# Patient Record
Sex: Female | Born: 1980 | Race: White | Hispanic: No | Marital: Single | Smoking: Current every day smoker
Health system: Southern US, Community
[De-identification: ages and names within clinical notes are randomized; demographics above are authoritative.]

---
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
## 2002-11-20 ENCOUNTER — Ambulatory Visit (HOSPITAL_COMMUNITY): Admission: RE | Admit: 2002-11-20 | Discharge: 2002-11-20 | Payer: Self-pay | Admitting: Obstetrics & Gynecology

## 2002-11-20 ENCOUNTER — Encounter: Payer: Self-pay | Admitting: Obstetrics & Gynecology

## 2003-04-13 ENCOUNTER — Inpatient Hospital Stay (HOSPITAL_COMMUNITY): Admission: AD | Admit: 2003-04-13 | Discharge: 2003-04-14 | Payer: Self-pay | Admitting: Obstetrics

## 2003-05-09 ENCOUNTER — Inpatient Hospital Stay (HOSPITAL_COMMUNITY): Admission: AD | Admit: 2003-05-09 | Discharge: 2003-05-09 | Payer: Self-pay | Admitting: Obstetrics & Gynecology

## 2003-05-17 ENCOUNTER — Inpatient Hospital Stay (HOSPITAL_COMMUNITY): Admission: AD | Admit: 2003-05-17 | Discharge: 2003-05-17 | Payer: Self-pay | Admitting: Obstetrics & Gynecology

## 2003-06-20 ENCOUNTER — Inpatient Hospital Stay (HOSPITAL_COMMUNITY): Admission: AD | Admit: 2003-06-20 | Discharge: 2003-06-23 | Payer: Self-pay | Admitting: Obstetrics

## 2003-06-21 ENCOUNTER — Encounter (INDEPENDENT_AMBULATORY_CARE_PROVIDER_SITE_OTHER): Payer: Self-pay | Admitting: Specialist

## 2004-01-16 ENCOUNTER — Emergency Department (HOSPITAL_COMMUNITY): Admission: EM | Admit: 2004-01-16 | Discharge: 2004-01-16 | Payer: Self-pay | Admitting: Emergency Medicine

## 2004-02-07 ENCOUNTER — Encounter
Admission: RE | Admit: 2004-02-07 | Discharge: 2004-04-01 | Payer: Self-pay | Admitting: Physical Medicine and Rehabilitation

## 2004-03-23 ENCOUNTER — Encounter
Admission: RE | Admit: 2004-03-23 | Discharge: 2004-03-23 | Payer: Self-pay | Admitting: Physical Medicine and Rehabilitation

## 2004-03-25 ENCOUNTER — Encounter
Admission: RE | Admit: 2004-03-25 | Discharge: 2004-03-25 | Payer: Self-pay | Admitting: Physical Medicine and Rehabilitation

## 2004-03-30 ENCOUNTER — Emergency Department (HOSPITAL_COMMUNITY): Admission: EM | Admit: 2004-03-30 | Discharge: 2004-03-30 | Payer: Self-pay | Admitting: Emergency Medicine

## 2005-07-19 ENCOUNTER — Emergency Department (HOSPITAL_COMMUNITY): Admission: EM | Admit: 2005-07-19 | Discharge: 2005-07-19 | Payer: Self-pay | Admitting: Emergency Medicine

## 2005-08-06 ENCOUNTER — Ambulatory Visit (HOSPITAL_COMMUNITY): Admission: RE | Admit: 2005-08-06 | Discharge: 2005-08-06 | Payer: Self-pay | Admitting: Obstetrics

## 2005-08-06 IMAGING — US US TRANSVAGINAL NON-OB
1 series · 13 of 25 positions shown · non-contrast
Comparison: none

CLINICAL DATA: Verify IUD placement.  LMP [DATE].
 TRANSABDOMINAL AND ENDOVAGINAL PELVIC ULTRASOUND:
TECHNIQUE: Both transabdominal and transvaginal ultrasound examinations of the pelvis were performed including evaluation of the uterus, ovaries, adnexal regions, and pelvic cul-de-sac.

[Series 1: us transvaginal non-ob · 0.27mm/px · 13 of 65 slices shown]
[im 1/65]
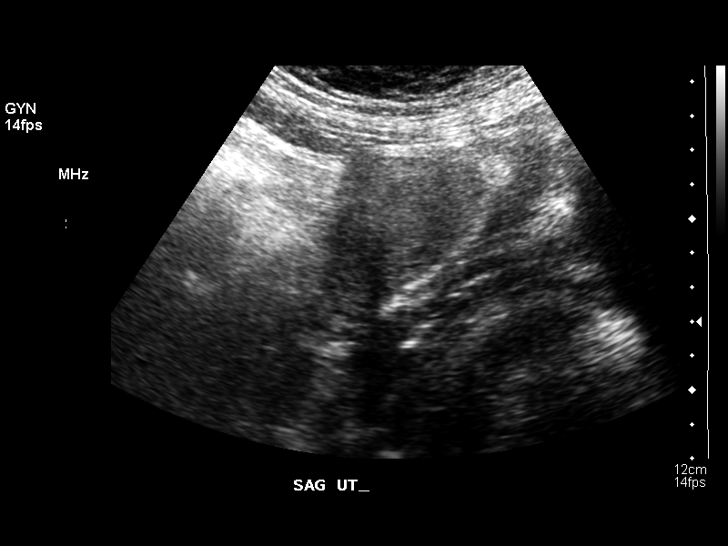
[im 6/65]
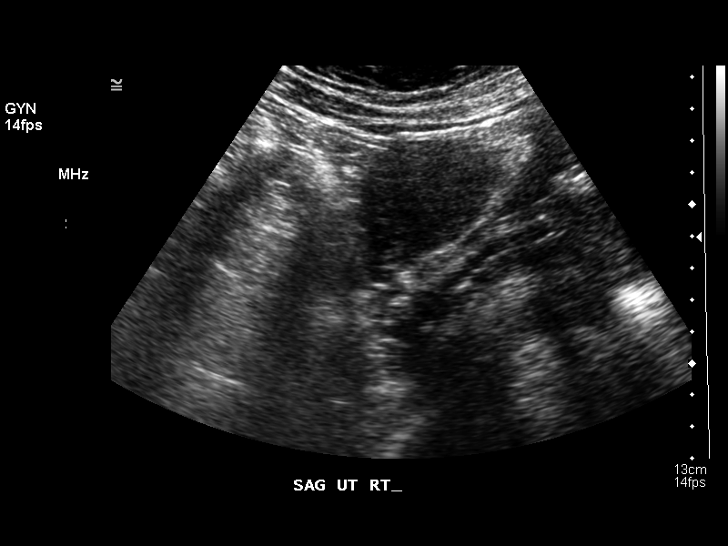
[im 11/65]
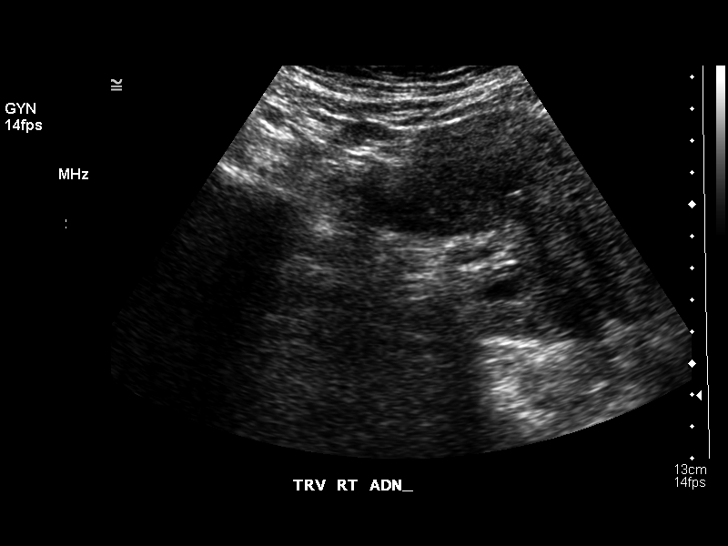
[im 17/65]
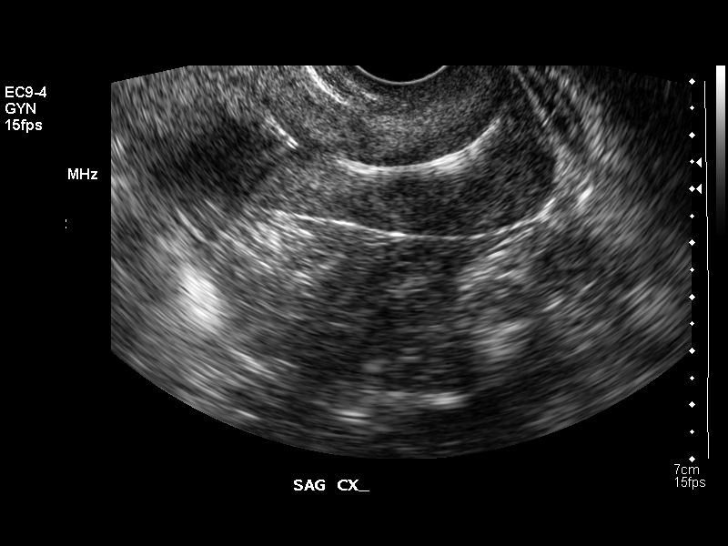
[im 22/65]
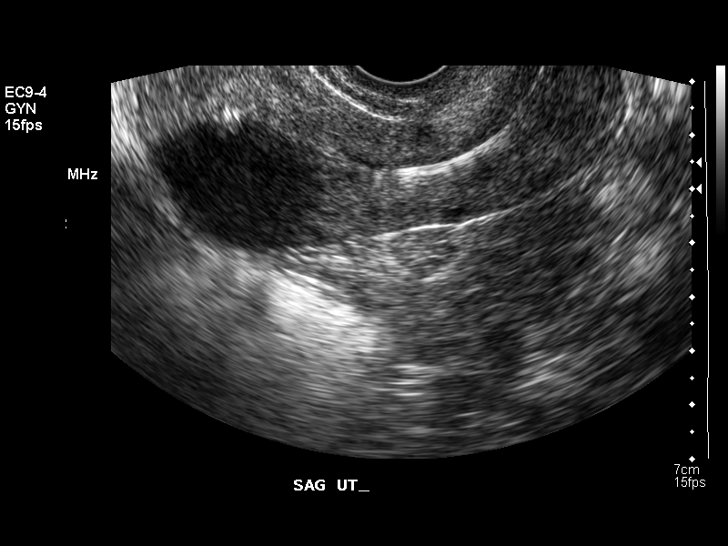
[im 27/65]
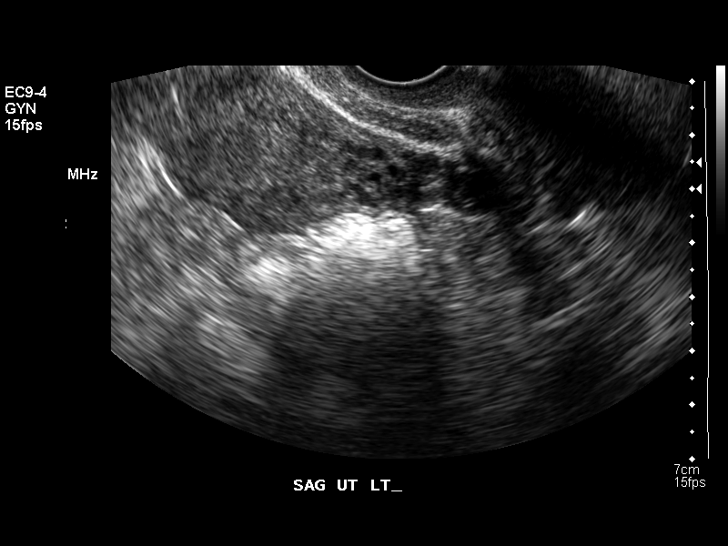
[im 33/65]
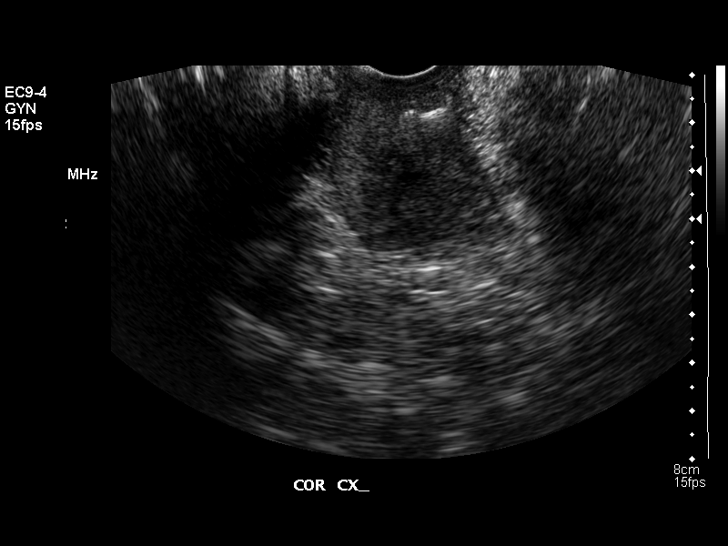
[im 38/65]
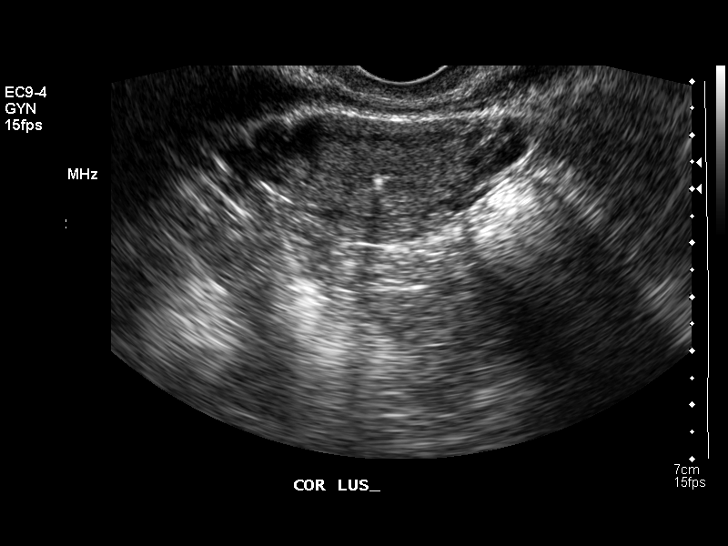
[im 43/65]
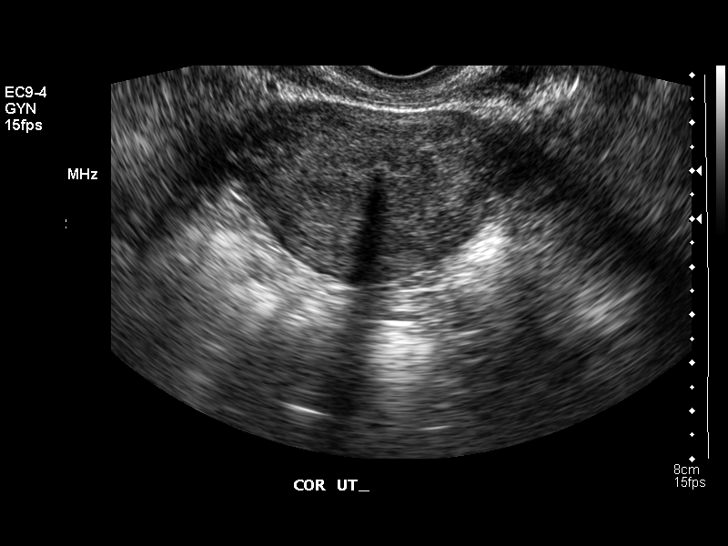
[im 49/65]
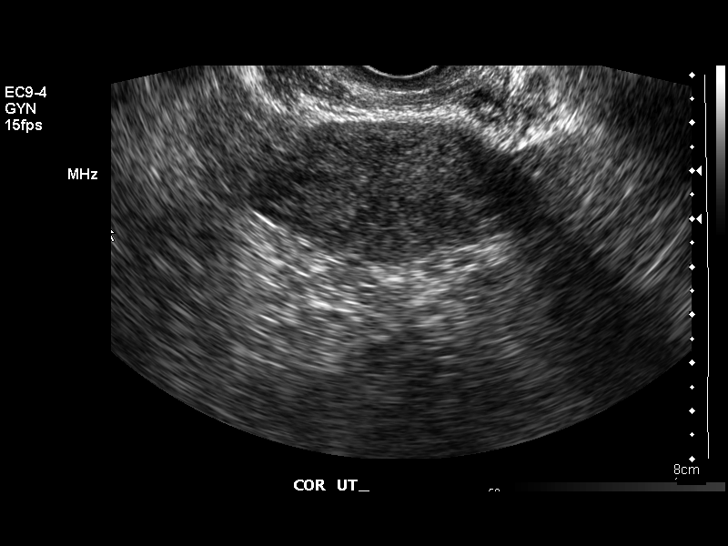
[im 54/65]
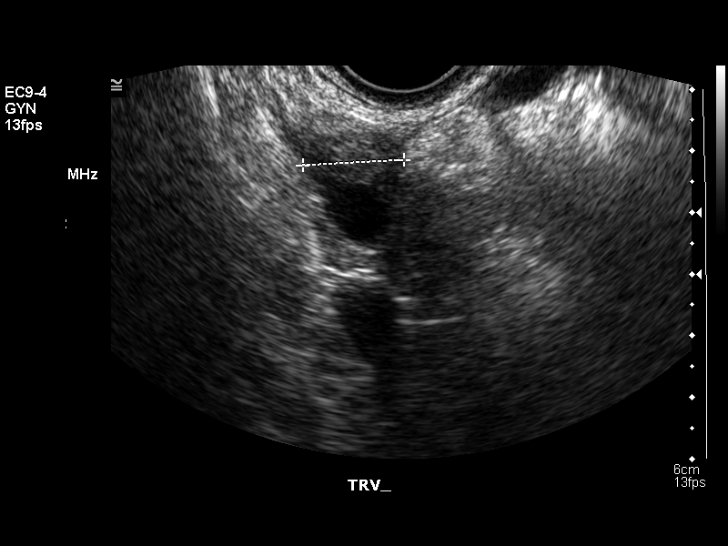
[im 59/65]
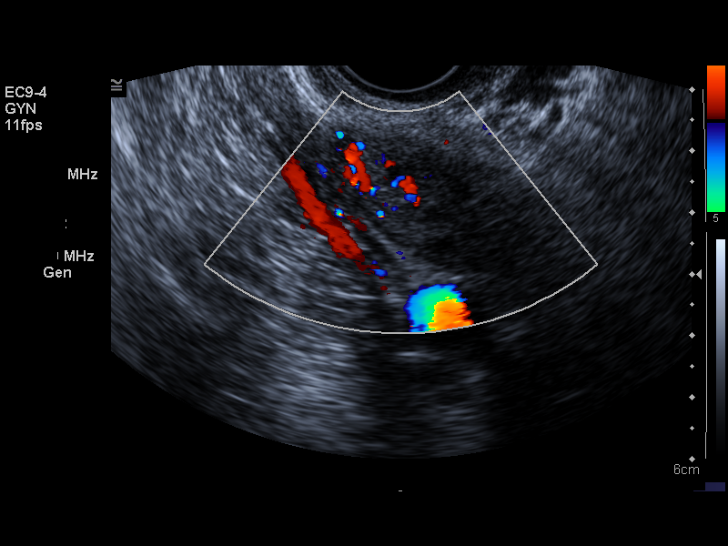
[im 65/65]
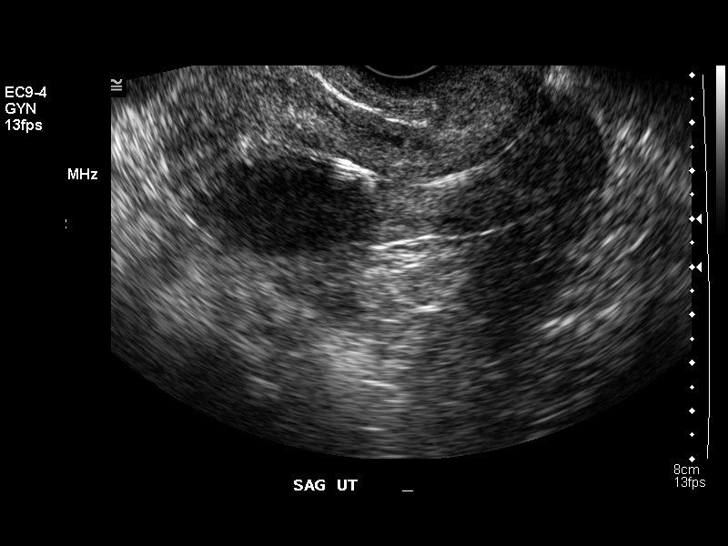

[13 of 25 positions shown; findings below may reference images not displayed]

FINDINGS: Multiple images of the uterus and adnexa were obtained using a transabdominal and endovaginal approaches. 
 The uterus has a sagittal length of 9.2 cm, AP width of 4.0 cm and a transverse width of 5.7 cm.  A homogeneous uterine myometrium is seen.  There is an IUD identified within the fundal portion of the endometrial canal in a good location with the IUD string identified coursing through the lower uterine segment and endocervical canal.  This limits evaluation of the endometrium, but no areas of definite focal thickening are noted. 
 Both ovaries are seen with the right ovary measuring 1.6 x 3.0 x 1.7 cm and containing a follicle.  The left ovary measures 3.1 x 1.7 x 2.0 cm and contains several subcentimeter follicles.  No cul-de-sac or periovarian fluid is seen and no separate adnexal masses are noted.
IMPRESSION: 1.  IUD location confirmed within the fundal portion of the endometrial canal in good position.  
 2.  Normal myometrium and ovaries.

## 2007-03-20 ENCOUNTER — Emergency Department (HOSPITAL_COMMUNITY): Admission: EM | Admit: 2007-03-20 | Discharge: 2007-03-20 | Payer: Self-pay | Admitting: Emergency Medicine

## 2008-03-17 ENCOUNTER — Inpatient Hospital Stay (HOSPITAL_COMMUNITY): Admission: AD | Admit: 2008-03-17 | Discharge: 2008-03-17 | Payer: Self-pay | Admitting: Obstetrics & Gynecology

## 2008-04-29 ENCOUNTER — Inpatient Hospital Stay (HOSPITAL_COMMUNITY): Admission: AD | Admit: 2008-04-29 | Discharge: 2008-04-29 | Payer: Self-pay | Admitting: Obstetrics

## 2008-06-13 ENCOUNTER — Inpatient Hospital Stay (HOSPITAL_COMMUNITY): Admission: AD | Admit: 2008-06-13 | Discharge: 2008-06-13 | Payer: Self-pay | Admitting: Obstetrics

## 2008-06-21 ENCOUNTER — Ambulatory Visit (HOSPITAL_COMMUNITY): Admission: RE | Admit: 2008-06-21 | Discharge: 2008-06-21 | Payer: Self-pay | Admitting: Obstetrics

## 2008-07-14 ENCOUNTER — Inpatient Hospital Stay (HOSPITAL_COMMUNITY): Admission: AD | Admit: 2008-07-14 | Discharge: 2008-07-14 | Payer: Self-pay | Admitting: Obstetrics & Gynecology

## 2008-07-14 ENCOUNTER — Ambulatory Visit: Payer: Self-pay | Admitting: Obstetrics and Gynecology

## 2008-09-22 ENCOUNTER — Inpatient Hospital Stay (HOSPITAL_COMMUNITY): Admission: AD | Admit: 2008-09-22 | Discharge: 2008-09-22 | Payer: Self-pay | Admitting: Obstetrics & Gynecology

## 2008-10-25 ENCOUNTER — Inpatient Hospital Stay (HOSPITAL_COMMUNITY): Admission: AD | Admit: 2008-10-25 | Discharge: 2008-10-25 | Payer: Self-pay | Admitting: Obstetrics

## 2008-11-02 ENCOUNTER — Inpatient Hospital Stay (HOSPITAL_COMMUNITY): Admission: AD | Admit: 2008-11-02 | Discharge: 2008-11-02 | Payer: Self-pay | Admitting: Obstetrics

## 2008-11-17 ENCOUNTER — Inpatient Hospital Stay (HOSPITAL_COMMUNITY): Admission: AD | Admit: 2008-11-17 | Discharge: 2008-11-20 | Payer: Self-pay | Admitting: Obstetrics

## 2009-01-25 ENCOUNTER — Ambulatory Visit (HOSPITAL_COMMUNITY): Admission: RE | Admit: 2009-01-25 | Discharge: 2009-01-25 | Payer: Self-pay | Admitting: Obstetrics & Gynecology

## 2009-07-08 ENCOUNTER — Encounter: Admission: RE | Admit: 2009-07-08 | Discharge: 2009-09-19 | Payer: Self-pay | Admitting: Family Medicine

## 2009-08-12 ENCOUNTER — Ambulatory Visit (HOSPITAL_COMMUNITY): Admission: RE | Admit: 2009-08-12 | Discharge: 2009-08-12 | Payer: Self-pay | Admitting: Obstetrics & Gynecology

## 2009-10-29 ENCOUNTER — Encounter: Admission: RE | Admit: 2009-10-29 | Discharge: 2009-10-29 | Payer: Self-pay | Admitting: Unknown Physician Specialty

## 2010-01-19 ENCOUNTER — Emergency Department (HOSPITAL_COMMUNITY)
Admission: EM | Admit: 2010-01-19 | Discharge: 2010-01-19 | Payer: Self-pay | Source: Home / Self Care | Admitting: Emergency Medicine

## 2010-03-25 ENCOUNTER — Encounter
Admission: RE | Admit: 2010-03-25 | Discharge: 2010-03-25 | Payer: Self-pay | Source: Home / Self Care | Attending: Unknown Physician Specialty | Admitting: Unknown Physician Specialty

## 2010-03-29 ENCOUNTER — Encounter: Payer: Self-pay | Admitting: Physical Medicine and Rehabilitation

## 2010-06-11 LAB — CBC
Hemoglobin: 12.9 g/dL (ref 12.0–15.0)
MCV: 86.6 fL (ref 78.0–100.0)
RBC: 4.4 MIL/uL (ref 3.87–5.11)
RDW: 13.3 % (ref 11.5–15.5)

## 2010-06-11 LAB — PREGNANCY, URINE: Preg Test, Ur: NEGATIVE

## 2010-06-13 LAB — CBC
HCT: 37 % (ref 36.0–46.0)
HCT: 37 % (ref 36.0–46.0)
Hemoglobin: 12.4 g/dL (ref 12.0–15.0)
MCHC: 33.5 g/dL (ref 30.0–36.0)
MCV: 87.3 fL (ref 78.0–100.0)
Platelets: 139 10*3/uL — ABNORMAL LOW (ref 150–400)
Platelets: 176 10*3/uL (ref 150–400)
RBC: 4.24 MIL/uL (ref 3.87–5.11)
RDW: 14.5 % (ref 11.5–15.5)
WBC: 10.3 10*3/uL (ref 4.0–10.5)
WBC: 6.7 10*3/uL (ref 4.0–10.5)

## 2010-06-14 LAB — URINALYSIS, ROUTINE W REFLEX MICROSCOPIC
Bilirubin Urine: NEGATIVE
Glucose, UA: NEGATIVE mg/dL
Hgb urine dipstick: NEGATIVE
Urobilinogen, UA: 0.2 mg/dL (ref 0.0–1.0)
pH: 6 (ref 5.0–8.0)

## 2010-06-14 LAB — COMPREHENSIVE METABOLIC PANEL
ALT: 23 U/L (ref 0–35)
AST: 26 U/L (ref 0–37)
CO2: 20 mEq/L (ref 19–32)
Calcium: 8.7 mg/dL (ref 8.4–10.5)
Chloride: 101 mEq/L (ref 96–112)
Creatinine, Ser: 0.51 mg/dL (ref 0.4–1.2)
GFR calc Af Amer: >60 mL/min (ref >60)
Glucose, Bld: 140 mg/dL — ABNORMAL HIGH (ref 70–99)
Potassium: 3.5 mEq/L (ref 3.5–5.1)
Total Protein: 6.8 g/dL (ref 6.0–8.3)

## 2010-06-14 LAB — CBC
HCT: 35 % — ABNORMAL LOW (ref 36.0–46.0)
MCHC: 34 g/dL (ref 30.0–36.0)
Platelets: 198 10*3/uL (ref 150–400)
RBC: 4.03 MIL/uL (ref 3.87–5.11)
RDW: 14.1 % (ref 11.5–15.5)
WBC: 8.5 10*3/uL (ref 4.0–10.5)

## 2010-06-14 LAB — URINE MICROSCOPIC-ADD ON

## 2010-06-15 LAB — URINALYSIS, ROUTINE W REFLEX MICROSCOPIC
Bilirubin Urine: NEGATIVE
Glucose, UA: NEGATIVE mg/dL
Hgb urine dipstick: NEGATIVE
Ketones, ur: NEGATIVE mg/dL
Nitrite: NEGATIVE
Urobilinogen, UA: 0.2 mg/dL (ref 0.0–1.0)
pH: 6 (ref 5.0–8.0)

## 2010-06-15 LAB — WET PREP, GENITAL

## 2010-06-17 LAB — URINALYSIS, ROUTINE W REFLEX MICROSCOPIC
Bilirubin Urine: NEGATIVE
Hgb urine dipstick: NEGATIVE
Ketones, ur: NEGATIVE mg/dL
Protein, ur: NEGATIVE mg/dL

## 2010-06-17 LAB — WET PREP, GENITAL
Clue Cells Wet Prep HPF POC: NONE SEEN
Trich, Wet Prep: NONE SEEN

## 2010-06-18 LAB — COMPREHENSIVE METABOLIC PANEL
Albumin: 3.2 g/dL — ABNORMAL LOW (ref 3.5–5.2)
BUN: 8 mg/dL (ref 6–23)
Calcium: 8.8 mg/dL (ref 8.4–10.5)
GFR calc Af Amer: >60 mL/min (ref >60)
Potassium: 3.3 mEq/L — ABNORMAL LOW (ref 3.5–5.1)
Sodium: 137 mEq/L (ref 135–145)
Total Protein: 6.4 g/dL (ref 6.0–8.3)

## 2010-06-18 LAB — CBC
HCT: 30.4 % — ABNORMAL LOW (ref 36.0–46.0)
MCV: 87.9 fL (ref 78.0–100.0)
RBC: 3.46 MIL/uL — ABNORMAL LOW (ref 3.87–5.11)

## 2010-06-18 LAB — URINALYSIS, ROUTINE W REFLEX MICROSCOPIC
Glucose, UA: NEGATIVE mg/dL
Ketones, ur: NEGATIVE mg/dL
Nitrite: NEGATIVE
Protein, ur: NEGATIVE mg/dL
Urobilinogen, UA: 0.2 mg/dL (ref 0.0–1.0)
pH: 7 (ref 5.0–8.0)

## 2010-06-23 LAB — WET PREP, GENITAL
Clue Cells Wet Prep HPF POC: NONE SEEN
Trich, Wet Prep: NONE SEEN

## 2010-11-27 LAB — BASIC METABOLIC PANEL WITH GFR
BUN: 9
CO2: 27
Chloride: 106
Creatinine, Ser: 0.63
Glucose, Bld: 111 — ABNORMAL HIGH
Potassium: 3.6

## 2010-11-27 LAB — CBC
HCT: 36.3
Hemoglobin: 12.6
MCHC: 34.6
MCV: 84.4
Platelets: 204
RBC: 4.3
RDW: 13.3
WBC: 7.8

## 2010-11-27 LAB — BASIC METABOLIC PANEL
Calcium: 9.6
GFR calc Af Amer: >60
GFR calc non Af Amer: >60
Sodium: 138

## 2010-11-27 LAB — DIFFERENTIAL
Basophils Absolute: 0
Basophils Relative: 0
Eosinophils Absolute: 0.1
Eosinophils Relative: 1
Lymphocytes Relative: 14
Lymphs Abs: 1.1
Monocytes Absolute: 0.6
Monocytes Relative: 7
Neutro Abs: 6
Neutrophils Relative %: 77

## 2011-07-16 ENCOUNTER — Other Ambulatory Visit (HOSPITAL_BASED_OUTPATIENT_CLINIC_OR_DEPARTMENT_OTHER): Payer: Self-pay | Admitting: Family Medicine

## 2011-07-16 DIAGNOSIS — M549 Dorsalgia, unspecified: Secondary | ICD-10-CM

## 2011-07-18 ENCOUNTER — Ambulatory Visit (HOSPITAL_BASED_OUTPATIENT_CLINIC_OR_DEPARTMENT_OTHER)
Admission: RE | Admit: 2011-07-18 | Discharge: 2011-07-18 | Disposition: A | Discharge status: Home / Self Care | Payer: Medicaid Other | Source: Ambulatory Visit | Attending: Family Medicine | Admitting: Family Medicine

## 2011-07-18 DIAGNOSIS — M549 Dorsalgia, unspecified: Secondary | ICD-10-CM | POA: Insufficient documentation

## 2011-07-18 DIAGNOSIS — G8929 Other chronic pain: Secondary | ICD-10-CM | POA: Insufficient documentation

## 2016-11-07 ENCOUNTER — Emergency Department (HOSPITAL_COMMUNITY)
Admission: EM | Admit: 2016-11-07 | Discharge: 2016-11-07 | Disposition: A | Discharge status: Home / Self Care | Payer: Self-pay | Attending: Emergency Medicine | Admitting: Emergency Medicine

## 2016-11-07 ENCOUNTER — Encounter (HOSPITAL_COMMUNITY): Payer: Self-pay | Admitting: *Deleted

## 2016-11-07 ENCOUNTER — Emergency Department (HOSPITAL_COMMUNITY): Payer: Self-pay

## 2016-11-07 DIAGNOSIS — Z79899 Other long term (current) drug therapy: Secondary | ICD-10-CM | POA: Insufficient documentation

## 2016-11-07 DIAGNOSIS — F17210 Nicotine dependence, cigarettes, uncomplicated: Secondary | ICD-10-CM | POA: Insufficient documentation

## 2016-11-07 DIAGNOSIS — R1030 Lower abdominal pain, unspecified: Secondary | ICD-10-CM | POA: Insufficient documentation

## 2016-11-07 LAB — URINALYSIS, ROUTINE W REFLEX MICROSCOPIC
BILIRUBIN URINE: NEGATIVE
GLUCOSE, UA: NEGATIVE mg/dL
KETONES UR: 5 mg/dL — AB
LEUKOCYTES UA: NEGATIVE
NITRITE: NEGATIVE
Protein, ur: NEGATIVE mg/dL
Specific Gravity, Urine: 1.02 (ref 1.005–1.030)
pH: 5 (ref 5.0–8.0)

## 2016-11-07 LAB — COMPREHENSIVE METABOLIC PANEL
ALT: 15 U/L (ref 14–54)
AST: 13 U/L — AB (ref 15–41)
Albumin: 4.4 g/dL (ref 3.5–5.0)
Alkaline Phosphatase: 42 U/L (ref 38–126)
Anion gap: 8 (ref 5–15)
BUN: 17 mg/dL (ref 6–20)
CHLORIDE: 107 mmol/L (ref 101–111)
CO2: 22 mmol/L (ref 22–32)
CREATININE: 0.61 mg/dL (ref 0.44–1.00)
Calcium: 8.9 mg/dL (ref 8.9–10.3)
Glucose, Bld: 104 mg/dL — ABNORMAL HIGH (ref 65–99)
Potassium: 3.8 mmol/L (ref 3.5–5.1)
Sodium: 137 mmol/L (ref 135–145)
TOTAL PROTEIN: 7.4 g/dL (ref 6.5–8.1)
Total Bilirubin: 0.6 mg/dL (ref 0.3–1.2)

## 2016-11-07 LAB — LIPASE, BLOOD: LIPASE: 25 U/L (ref 11–51)

## 2016-11-07 LAB — CBC
HCT: 38.9 % (ref 36.0–46.0)
Hemoglobin: 13.4 g/dL (ref 12.0–15.0)
MCH: 29.1 pg (ref 26.0–34.0)
MCHC: 34.4 g/dL (ref 30.0–36.0)
MCV: 84.4 fL (ref 78.0–100.0)
PLATELETS: 254 10*3/uL (ref 150–400)
RBC: 4.61 MIL/uL (ref 3.87–5.11)
RDW: 13.7 % (ref 11.5–15.5)
WBC: 11.3 10*3/uL — AB (ref 4.0–10.5)

## 2016-11-07 LAB — WET PREP, GENITAL
Clue Cells Wet Prep HPF POC: NONE SEEN
Sperm: NONE SEEN
Trich, Wet Prep: NONE SEEN
Yeast Wet Prep HPF POC: NONE SEEN

## 2016-11-07 LAB — PREGNANCY, URINE: Preg Test, Ur: NEGATIVE

## 2016-11-07 MED ORDER — IOPAMIDOL (ISOVUE-300) INJECTION 61%
INTRAVENOUS | Status: AC
  Filled 2016-11-07: qty 100

## 2016-11-07 NOTE — Discharge Instructions (Signed)
Alternate 600 mg of ibuprofen and [phone] mg of Tylenol every 3 hours as needed for pain. Do not exceed 4000 mg of Tylenol daily. Apply ice or heat to the affected area for comfort. Return to the ED IMMEDIATELY if any concerning signs or symptoms develop such as worsening pain, fevers, blood in your urine or stool, vomiting, or localization of your pain to the right lower quadrant as this may indicate an appendicitis. Follow-up with your primary care physician for reevaluation otherwise.

## 2016-11-07 NOTE — ED Provider Notes (Signed)
[facility]-EMERGENCY DEPT Provider Note   CSN: [id number] Arrival date & time: 11/07/16  1228     History   Chief Complaint Chief Complaint  Patient presents with  . Abdominal Pain    HPI Breanna Cardenas is a 36 y.o. female who presents today with chief complaint of acute onset, somewhat improving abdominal pain. She states that around 9 AM this morning when she was at work, she experienced sudden onset of 7/10 generalized abdominal pain with radiation to the left flank and associated episode of nausea which resolved shortly thereafter. She states that since then her pain has improved to 5/10 in severity. No aggravating or alleviating factors noted. She has not tried anything for her symptoms. She denies fevers, chills, CP, SOB, n/v/d, urinary symptoms, vaginal complaints, melena, or hematochezia. She does not have a history of kidney stones. Last oral intake was this morning and was a bite of an egg and a sausage. Prior to that, the patient had a meatball Sub from a fast food restaurant yesterday evening.no known sick contacts. She does note a sensitivity to aspartame and xylitol but states she can tolerate other synthetic sugars and this pain feels differently from that pain.   The history is provided by the patient.    History reviewed. No pertinent past medical history.  There are no active problems to display for this patient.   History reviewed. No pertinent surgical history.  OB History    No data available       Home Medications    Prior to Admission medications   Medication Sig Start Date End Date Taking? Authorizing Provider  5-Hydroxytryptophan (5-HTP PO) Take 1 tablet by mouth daily.   Yes [provider]  ibuprofen (ADVIL,MOTRIN) 200 MG tablet Take 1,000 mg by mouth every 6 (six) hours as needed for moderate pain.   Yes [provider]  Multiple Vitamin (MULTIVITAMIN WITH MINERALS) TABS tablet Take 1 tablet by mouth daily.   Yes [provider]    Family History No family history on file.  Social History Social History  Substance Use Topics  . Smoking status: Current Every Day Smoker  . Smokeless tobacco: Never Used  . Alcohol use Yes     Allergies   Patient has no known allergies.   Review of Systems Review of Systems  Constitutional: Negative for chills and fever.  Respiratory: Negative for shortness of breath.   Cardiovascular: Negative for chest pain.  Gastrointestinal: Positive for abdominal pain and nausea. Negative for vomiting.  Genitourinary: Negative for dysuria, hematuria, pelvic pain, vaginal bleeding, vaginal discharge and vaginal pain.  All other systems reviewed and are negative.    Physical Exam Updated Vital Signs BP 124/82 (BP Location: Right Arm)   Pulse 85   Temp 98.3 F (36.8 C) (Oral)   Resp 14   LMP 10/14/2016   SpO2 99%   Physical Exam  Constitutional: She appears well-developed and well-nourished. No distress.  Resting comfortably in chair  HENT:  Head: Normocephalic and atraumatic.  Eyes: Conjunctivae are normal. Right eye exhibits no discharge. Left eye exhibits no discharge.  Neck: Normal range of motion. Neck supple. No JVD present. No tracheal deviation present.  Cardiovascular: Normal rate, regular rhythm and normal heart sounds.   Pulmonary/Chest: Effort normal and breath sounds normal. No respiratory distress. She has no wheezes. She has no rales. She exhibits no tenderness.  Abdominal: Soft. Bowel sounds are normal. She exhibits no distension. There is tenderness.  Suprapubic and right  lower quadrant TTP. There is mild tenderness to palpation at McBurney's point, Rovsing sign absent, Murphy sign absent, psoas sign absent. No CVA tenderness  Genitourinary:  Genitourinary Comments: Examination performed in the presence of a chaperone. No lesions to the external genitalia. Cervical os is closed and multi parous in appearance. Moderate amount of white creamy  discharge in the vaginal vault.no masses or lesions to the vaginal wall. No cervical motion tenderness, leftadnexal tenderness present.uterus is soft and symmetrical.  Musculoskeletal: She exhibits no edema.  No midline spine TTP, no paraspinal muscle tenderness, no deformity, crepitus, or step-off noted   Neurological: She is alert.  Skin: Skin is warm and dry. No erythema.  Psychiatric: She has a normal mood and affect. Her behavior is normal.  Nursing note and vitals reviewed.    ED Treatments / Results  Labs (all labs ordered are listed, but only abnormal results are displayed) Labs Reviewed  WET PREP, GENITAL - Abnormal; Notable for the following:       Result Value   WBC, Wet Prep HPF POC MANY (*)    All other components within normal limits  COMPREHENSIVE METABOLIC PANEL - Abnormal; Notable for the following:    Glucose, Bld 104 (*)    AST 13 (*)    All other components within normal limits  CBC - Abnormal; Notable for the following:    WBC 11.3 (*)    All other components within normal limits  URINALYSIS, ROUTINE W REFLEX MICROSCOPIC - Abnormal; Notable for the following:    Hgb urine dipstick SMALL (*)    Ketones, ur 5 (*)    Bacteria, UA RARE (*)    Squamous Epithelial / LPF 0-5 (*)    All other components within normal limits  LIPASE, BLOOD  PREGNANCY, URINE  GC/CHLAMYDIA PROBE AMP ([place]) NOT AT [facility]    EKG  EKG Interpretation None       Radiology No results found.  Procedures Procedures (including critical care time)  Medications Ordered in ED Medications  iopamidol (ISOVUE-300) 61 % injection (not administered)     Initial Impression / Assessment and Plan / ED Course  I have reviewed the triage vital signs and the nursing notes.  Pertinent labs & imaging results that were available during my care of the patient were reviewed by me and considered in my medical decision making (see chart for details).     Patient with lower abdominal  pain since this morning, progressively improving.afebrile, vital signs are stable, and patient is nontoxic in appearance. Initially tender in the suprapubic and right lower quadrant, but on pelvic examination, she had left lower quadrant tenderness.she does have a nonspecific leukocytosis of 11.3, which is a significant increase from her baseline. Wet prep shows WBCs but no evidence of yeast, trichomoniasis, or BV. With leukocytosis and pain, will obtain CT scan for further evaluation and rule out of appendicitis versus emergent surgical abdominal pathology.  4:38 PM   Patient wants to leave against medical advice prior to obtaining CT due to not having childcare available for her children. Patient understands that his/her actions will lead to inadequate medical workup, and that he/she is at risk of complications of missed diagnosis, which includes morbidity and mortality.  Alternative options discussed, opportunity to change mind given, discussion witnessed by RN. Patient is demonstrating good capacity to make decision. Patient understands that he/she needs to return to the ER immediately if his/her symptoms get worse.  Final Clinical Impressions(s) / ED Diagnoses  Final diagnoses:  Lower abdominal pain    New Prescriptions New Prescriptions   No medications on file     Bennye AlmFawze, Buddie Marston A, PA-C 11/07/16 1639    Shaune PollackIsaacs, [place], MD 11/08/16 [phone]

## 2016-11-07 NOTE — ED Notes (Signed)
Patient wanting to know results of testing. Patient stated to writer "I do not want to wait on the results of my test, I want to go home. I hate waiting".

## 2016-11-07 NOTE — ED Triage Notes (Signed)
Pt complains of left sided abdominal pain radiating to her back since 9AM this morning. Pain began while pt was at work. Pt states she had some nausea, denies emesis or diarrhea.

## 2016-11-12 LAB — GC/CHLAMYDIA PROBE AMP (~~LOC~~) NOT AT ARMC
Chlamydia: NEGATIVE
Neisseria Gonorrhea: NEGATIVE

## 2017-11-15 ENCOUNTER — Ambulatory Visit: Payer: Self-pay | Admitting: Obstetrics
